# Patient Record
Sex: Female | Born: 1980 | Race: Black or African American | Hispanic: No | Marital: Married | Smoking: Never smoker
Health system: Southern US, Community
[De-identification: ages and names within clinical notes are randomized; demographics above are authoritative.]

## PROBLEM LIST (undated history)

## (undated) DIAGNOSIS — D649 Anemia, unspecified: Secondary | ICD-10-CM

## (undated) HISTORY — DX: Anemia, unspecified: D64.9

---

## 2009-03-10 ENCOUNTER — Ambulatory Visit: Payer: Self-pay | Admitting: Family Medicine

## 2009-03-10 ENCOUNTER — Other Ambulatory Visit: Admission: RE | Admit: 2009-03-10 | Discharge: 2009-03-10 | Payer: Self-pay | Admitting: Family Medicine

## 2009-05-02 ENCOUNTER — Ambulatory Visit: Payer: Self-pay | Admitting: Family Medicine

## 2009-07-29 ENCOUNTER — Inpatient Hospital Stay (HOSPITAL_COMMUNITY): Admission: AD | Admit: 2009-07-29 | Discharge: 2009-07-29 | Payer: Self-pay | Admitting: Obstetrics & Gynecology

## 2009-10-19 ENCOUNTER — Ambulatory Visit (HOSPITAL_COMMUNITY): Admission: RE | Admit: 2009-10-19 | Discharge: 2009-10-19 | Payer: Self-pay | Admitting: Obstetrics & Gynecology

## 2010-03-04 ENCOUNTER — Encounter (INDEPENDENT_AMBULATORY_CARE_PROVIDER_SITE_OTHER): Payer: Self-pay | Admitting: Obstetrics & Gynecology

## 2010-03-04 ENCOUNTER — Inpatient Hospital Stay (HOSPITAL_COMMUNITY): Admission: RE | Admit: 2010-03-04 | Discharge: 2010-03-06 | Payer: Self-pay | Admitting: Obstetrics & Gynecology

## 2010-03-07 ENCOUNTER — Ambulatory Visit: Payer: Self-pay | Admitting: Family Medicine

## 2010-03-07 ENCOUNTER — Ambulatory Visit: Payer: Self-pay | Admitting: Advanced Practice Midwife

## 2010-03-07 ENCOUNTER — Inpatient Hospital Stay (HOSPITAL_COMMUNITY): Admission: AD | Admit: 2010-03-07 | Discharge: 2010-03-07 | Payer: Self-pay | Admitting: Obstetrics & Gynecology

## 2010-10-28 LAB — COMPREHENSIVE METABOLIC PANEL
AST: 44 U/L — ABNORMAL HIGH (ref 0–37)
Albumin: 2.4 g/dL — ABNORMAL LOW (ref 3.5–5.2)
BUN: 3 mg/dL — ABNORMAL LOW (ref 6–23)
Calcium: 8.2 mg/dL — ABNORMAL LOW (ref 8.4–10.5)
Chloride: 106 mEq/L (ref 96–112)
Creatinine, Ser: 0.57 mg/dL (ref 0.4–1.2)
GFR calc Af Amer: >60 mL/min (ref >60)
Sodium: 137 mEq/L (ref 135–145)

## 2010-10-28 LAB — CBC
HCT: 35.4 % — ABNORMAL LOW (ref 36.0–46.0)
Hemoglobin: 11.7 g/dL — ABNORMAL LOW (ref 12.0–15.0)
Hemoglobin: 12.7 g/dL (ref 12.0–15.0)
Hemoglobin: 13.4 g/dL (ref 12.0–15.0)
MCH: 29.9 pg (ref 26.0–34.0)
MCHC: 32.6 g/dL (ref 30.0–36.0)
MCHC: 33.1 g/dL (ref 30.0–36.0)
MCHC: 33.4 g/dL (ref 30.0–36.0)
MCV: 89.5 fL (ref 78.0–100.0)
MCV: 91.2 fL (ref 78.0–100.0)
Platelets: 83 10*3/uL — ABNORMAL LOW (ref 150–400)
RBC: 4.24 MIL/uL (ref 3.87–5.11)
RBC: 4.52 MIL/uL (ref 3.87–5.11)
RDW: 14.7 % (ref 11.5–15.5)
RDW: 15.4 % (ref 11.5–15.5)
WBC: 6.9 10*3/uL (ref 4.0–10.5)

## 2010-10-28 LAB — TYPE AND SCREEN: Antibody Screen: NEGATIVE

## 2010-10-28 LAB — DIFFERENTIAL
Basophils Relative: 0 % (ref 0–1)
Neutro Abs: 4.8 10*3/uL (ref 1.7–7.7)
Neutrophils Relative %: 69 % (ref 43–77)

## 2010-10-28 LAB — SURGICAL PCR SCREEN: Staphylococcus aureus: NEGATIVE

## 2010-11-13 LAB — CBC
Hemoglobin: 13.2 g/dL (ref 12.0–15.0)
MCHC: 32.7 g/dL (ref 30.0–36.0)
Platelets: 147 10*3/uL — ABNORMAL LOW (ref 150–400)
RBC: 4.69 MIL/uL (ref 3.87–5.11)
WBC: 9.8 10*3/uL (ref 4.0–10.5)

## 2010-11-13 LAB — GC/CHLAMYDIA PROBE AMP, GENITAL
Chlamydia, DNA Probe: NEGATIVE
GC Probe Amp, Genital: NEGATIVE

## 2010-11-13 LAB — URINALYSIS, ROUTINE W REFLEX MICROSCOPIC
Nitrite: NEGATIVE
Specific Gravity, Urine: 1.025 (ref 1.005–1.030)

## 2010-11-13 LAB — WET PREP, GENITAL
Trich, Wet Prep: NONE SEEN
Yeast Wet Prep HPF POC: NONE SEEN

## 2010-11-13 LAB — HCG, QUANTITATIVE, PREGNANCY: hCG, Beta Chain, Quant, S: 86040 m[IU]/mL — ABNORMAL HIGH (ref <5)

## 2010-11-13 LAB — POCT PREGNANCY, URINE: Preg Test, Ur: POSITIVE

## 2011-04-18 ENCOUNTER — Encounter: Payer: Self-pay | Admitting: Family Medicine

## 2012-05-05 ENCOUNTER — Telehealth: Payer: Self-pay | Admitting: Family Medicine

## 2012-05-05 NOTE — Telephone Encounter (Signed)
LM

## 2013-06-22 ENCOUNTER — Ambulatory Visit (INDEPENDENT_AMBULATORY_CARE_PROVIDER_SITE_OTHER): Payer: BC Managed Care – PPO | Admitting: Medical

## 2013-06-22 ENCOUNTER — Encounter: Payer: Self-pay | Admitting: Medical

## 2013-06-22 VITALS — BP 90/70 | HR 68 | Temp 98.2°F | Resp 16 | Wt 144.0 lb

## 2013-06-22 DIAGNOSIS — S99921A Unspecified injury of right foot, initial encounter: Secondary | ICD-10-CM

## 2013-06-22 DIAGNOSIS — S8990XA Unspecified injury of unspecified lower leg, initial encounter: Secondary | ICD-10-CM

## 2013-06-22 DIAGNOSIS — I998 Other disorder of circulatory system: Secondary | ICD-10-CM

## 2013-06-22 DIAGNOSIS — R58 Hemorrhage, not elsewhere classified: Secondary | ICD-10-CM

## 2013-06-22 NOTE — Progress Notes (Signed)
Subjective: Here today for injury.  early this morning after getting out of the shower, stumped her right small toe against the bathroom door.  Fell to the ground in pain.   Toe has since been painful, limited ROM, feels swollen, but main issue is the pain.   Has used ice once, elevation.   Currently toe is throbbing in pain.   Objective: Gen: wd, wn, nad Skin: normal skin coloration of right small toe. MSK: right small toe tender throughout, decreased ROM due to pain, no obvious deformity or obvious swelling, rest of right foot and toes normal exam Pedal pulses and cap refill normal of right foot and small toe Neuro:  Seems to be somewhat decreased light touch sensation of right distal foot and 3rd - 5th toes, but otherwise normal sensation, strength not fully tested of right 5th toe due to pain   Assessment: Encounter Diagnoses  Name Primary?  . Toe injury, right, initial encounter Yes  . Ecchymosis     Plan: She declines xray at this time.   Buddy taped toe.  Script for post op shoe for support and protection.   Advised OTC Aleve or Ibuprofen for pain inflammation, ice, rest, buddy tape, use post op shoe, and elevation.  If not improving in regards to pain or sensation by Wednesday, then call back so we can order xray.  May take a few weeks to fully resolve, but wear the post op shoe 1-2 wk initially.  Recheck 10-14 days in general.

## 2013-06-22 NOTE — Patient Instructions (Signed)
RICE  Rest the foot, use the post op shoe, avoid re injury  Ice the foot 3-4 times daily if possible.  Compression - with buddy taping.  Elevate the leg when possible.   Begin Naprelan 750mg  today for pain.  tomorrow and the rest of the week, use Aleve 1-2 tablets once to twice daily or Ibuprofen, 3 tablets 3 times daily this week.

## 2016-07-18 ENCOUNTER — Other Ambulatory Visit: Payer: Self-pay | Admitting: Obstetrics & Gynecology

## 2016-07-18 DIAGNOSIS — N644 Mastodynia: Secondary | ICD-10-CM

## 2017-08-22 ENCOUNTER — Encounter: Payer: Self-pay | Admitting: Family Medicine

## 2017-08-22 ENCOUNTER — Ambulatory Visit: Payer: BC Managed Care – PPO | Admitting: Family Medicine

## 2017-08-22 VITALS — BP 124/82 | HR 79 | Temp 98.4°F | Ht 61.0 in | Wt 145.2 lb

## 2017-08-22 DIAGNOSIS — J029 Acute pharyngitis, unspecified: Secondary | ICD-10-CM | POA: Diagnosis not present

## 2017-08-22 DIAGNOSIS — J069 Acute upper respiratory infection, unspecified: Secondary | ICD-10-CM

## 2017-08-22 LAB — POCT RAPID STREP A (OFFICE): RAPID STREP A SCREEN: NEGATIVE

## 2017-08-22 MED ORDER — SULFAMETHOXAZOLE-TRIMETHOPRIM 800-160 MG PO TABS: 1.0000 | ORAL_TABLET | Freq: Two times a day (BID) | ORAL | 0 refills | Status: DC

## 2017-08-22 NOTE — Progress Notes (Signed)
Chief Complaint  Patient presents with  . Acute Visit    sore throat, sneezing, coughing, fatigue, congestion, tried OTC medication no relief    Subjective:  Kristina Butler is a 37 y.o. female who presents for a 4-5 day history of fatigue, sore throat, right ear ache, mild sinus pressure,post nasal drainage, hoarseness, and mild cough.  Denies fever, chills, body aches, rhinorrhea, nasal congestion, chest pain, palpitations, shortness of breath, wheezing, abdominal pain, N/V/D.   Treatment to date: Theraflu.  Denies sick contacts.  No other aggravating or relieving factors.  No other c/o.  ROS as in subjective.   Objective: Vitals:   08/22/17 1145  BP: 124/82  Pulse: 79  Temp: 98.4 F (36.9 C)  SpO2: 98%    General appearance: Alert, WD/WN, no distress, mildly ill appearing                             Skin: warm, no rash                           Head: no sinus tenderness                            Eyes: conjunctiva normal, corneas clear, PERRLA                            Ears: pearly TMs, external ear canals normal                          Nose: septum midline, turbinates swollen, with erythema and clear discharge             Mouth/throat: MMM, tongue normal, mild pharyngeal erythema, no edema, or exudate.                            Neck: supple, no adenopathy, no thyromegaly, nontender                          Heart: RRR, normal S1, S2, no murmurs                         Lungs: CTA bilaterally, no wheezes, rales, or rhonchi      Assessment: Acute pharyngitis, unspecified etiology - Plan: Rapid Strep A, sulfamethoxazole-trimethoprim (BACTRIM DS,SEPTRA DS) 800-160 MG tablet  Acute URI   Plan: Discussed diagnosis and treatment of URI and acute pharyngitis. Rapid strep swab negative. Counseled on viral illness and that antibiotics do not help with this. Bactrim prescribed in case she is not improving in the next 2-3 days.   Suggested symptomatic OTC remedies. Salt water  gargles. Rest, hydration. Nasal saline spray for congestion.  Tylenol or Ibuprofen OTC for fever and malaise.  Return as needed.

## 2017-08-22 NOTE — Patient Instructions (Signed)
You appear to have a viral illness. Your strep test is negative.  I recommend treating your symptoms for now and giving this more time. Tylenol or ibuprofen, salt water gargles, chloraseptic also if needed. Stay well hydrated, rest and if you get worse or are not improving in the next 2-3 days then take the antibiotic.

## 2018-01-14 ENCOUNTER — Ambulatory Visit (HOSPITAL_COMMUNITY)
Admission: EM | Admit: 2018-01-14 | Discharge: 2018-01-14 | Disposition: A | Discharge status: Home / Self Care | Payer: BC Managed Care – PPO | Attending: Emergency Medicine | Admitting: Emergency Medicine

## 2018-01-14 ENCOUNTER — Ambulatory Visit (INDEPENDENT_AMBULATORY_CARE_PROVIDER_SITE_OTHER): Payer: BC Managed Care – PPO

## 2018-01-14 ENCOUNTER — Encounter (HOSPITAL_COMMUNITY): Payer: Self-pay | Admitting: Emergency Medicine

## 2018-01-14 DIAGNOSIS — S90811A Abrasion, right foot, initial encounter: Secondary | ICD-10-CM

## 2018-01-14 DIAGNOSIS — M79671 Pain in right foot: Secondary | ICD-10-CM | POA: Diagnosis not present

## 2018-01-14 DIAGNOSIS — W19XXXA Unspecified fall, initial encounter: Secondary | ICD-10-CM | POA: Diagnosis not present

## 2018-01-14 DIAGNOSIS — S62325A Displaced fracture of shaft of fourth metacarpal bone, left hand, initial encounter for closed fracture: Secondary | ICD-10-CM

## 2018-01-14 MED ORDER — IBUPROFEN 800 MG PO TABS: 800.0000 mg | ORAL_TABLET | Freq: Three times a day (TID) | ORAL | 0 refills | Status: AC

## 2018-01-14 NOTE — Progress Notes (Signed)
Orthopedic Tech Progress Note Patient Details:  Sofie Hartiganisha T Shur 1980-10-12 [id number]  Ortho Devices Type of Ortho Device: Ace wrap, Ulna gutter splint Ortho Device/Splint Location: lue Ortho Device/Splint Interventions: Application   Post Interventions Patient Tolerated: Well Instructions Provided: Care of device   Nikki DomCrawford, Micah Galeno 01/14/2018, 11:52 AM

## 2018-01-14 NOTE — ED Notes (Signed)
Ortho aware of splint 

## 2018-01-14 NOTE — ED Provider Notes (Signed)
[facility]    CSN: [id number] Arrival date & time: 01/14/18  1005     History   Chief Complaint Chief Complaint  Patient presents with  . Hand Pain    HPI Kristina Butler is a 37 y.o. female no contributing past medical history presenting today for evaluation of left hand pain after a fall.  Patient states that she was in her bathtub and tried to kill a bug and accidentally slipped and fell.  Denies hitting head or loss of consciousness.  Denies changes in vision, nausea, vomiting.  Her main complaint is pain and swelling in her left hand.  Has had difficulty moving her left fourth finger.  Denies numbness or tingling.  Has been applying ice.  Also having pain along her right foot, she scratched this on the faucet, tolerating weightbearing, states pain mainly along scratch.  HPI  Past Medical History:  Diagnosis Date  . Anemia     There are no active problems to display for this patient.   History reviewed. No pertinent surgical history.  OB History   None      Home Medications    Prior to Admission medications   Medication Sig Start Date End Date Taking? Authorizing Provider  ibuprofen (ADVIL,MOTRIN) 800 MG tablet Take 1 tablet (800 mg total) by mouth 3 (three) times daily. 01/14/18   Lew DawesWieters, Hallie C, PA-C  SPRINTEC 28 0.25-35 MG-MCG tablet  06/27/17   [provider]  sulfamethoxazole-trimethoprim (BACTRIM DS,SEPTRA DS) 800-160 MG tablet Take 1 tablet by mouth 2 (two) times daily. 08/22/17   Avanell ShackletonHenson, Vickie L, NP-C    Family History History reviewed. No pertinent family history.  Social History Social History   Tobacco Use  . Smoking status: Never Smoker  . Smokeless tobacco: Never Used  Substance Use Topics  . Alcohol use: No    Frequency: Never  . Drug use: No     Allergies   Penicillins   Review of Systems Review of Systems  Constitutional: Negative for activity change and appetite change.  Eyes: Negative for pain and visual  disturbance.  Respiratory: Negative for shortness of breath.   Cardiovascular: Negative for chest pain.  Gastrointestinal: Negative for abdominal pain, nausea and vomiting.  Musculoskeletal: Positive for arthralgias, joint swelling and myalgias. Negative for back pain, gait problem, neck pain and neck stiffness.  Skin: Positive for wound. Negative for color change.  Neurological: Negative for dizziness, seizures, syncope, weakness, light-headedness, numbness and headaches.     Physical Exam Triage Vital Signs ED Triage Vitals [01/14/18 1033]  Enc Vitals Group     BP 129/88     Pulse Rate 66     Resp 18     Temp 98 F (36.7 C)     Temp Source Oral     SpO2 98 %     Weight      Height      Head Circumference      Peak Flow      Pain Score      Pain Loc      Pain Edu?      Excl. in GC?    No data found.  Updated Vital Signs BP 129/88 (BP Location: Right Arm)   Pulse 66   Temp 98 F (36.7 C) (Oral)   Resp 18   SpO2 98%   Visual Acuity Right Eye Distance:   Left Eye Distance:   Bilateral Distance:    Right Eye Near:   Left  Eye Near:    Bilateral Near:     Physical Exam  Constitutional: She appears well-developed and well-nourished. No distress.  HENT:  Head: Normocephalic and atraumatic.  Eyes: Conjunctivae are normal.  Neck: Neck supple.  Cardiovascular: Normal rate.  No murmur heard. Pulmonary/Chest: Effort normal. No respiratory distress.  Musculoskeletal: She exhibits edema, tenderness and deformity.  Significant swelling around left fourth metacarpal extending to fourth PIP, limited range of motion of fourth finger, significant tenderness over swelling, tenderness to palpation of distal radius and ulna.  Superficial scrape extending along the dorsum of foot and to the anterior distal shin.,  Tenderness along scrape, nontender along medial lateral malleolus, nontender to other metatarsals.  Gait without abnormality, full active range of motion at ankle.    Neurological: She is alert.  Skin: Skin is warm and dry.  Psychiatric: She has a normal mood and affect.  Nursing note and vitals reviewed.    UC Treatments / Results  Labs (all labs ordered are listed, but only abnormal results are displayed) Labs Reviewed - No data to display  EKG None  Radiology Dg Hand Complete Left  Result Date: 01/14/2018 CLINICAL DATA:  Hand swelling, fell in the shower this morning EXAM: LEFT HAND - COMPLETE 3+ VIEW COMPARISON:  None FINDINGS: Osseous mineralization normal for technique. Joint spaces preserved. Oblique fracture of the fourth metacarpal diaphysis, minimally displaced radially and volar. Soft tissue swelling LEFT ring finger. No additional fracture, dislocation, or bone destruction. IMPRESSION: Displaced oblique fracture of the LEFT fourth metacarpal diaphysis Electronically Signed   By: Ulyses Southward M.D.   On: 01/14/2018 10:55    Procedures Procedures (including critical care time)  Medications Ordered in UC Medications - No data to display  Initial Impression / Assessment and Plan / UC Course  I have reviewed the triage vital signs and the nursing notes.  Pertinent labs & imaging results that were available during my care of the patient were reviewed by me and considered in my medical decision making (see chart for details).     Patient with fourth left metacarpal shaft fracture.  Will apply ulnar gutter splint and have follow-up with orthopedics.  NSAIDs for pain.  Ice and elevation.  Discussed strict return precautions. Patient verbalized understanding and is agreeable with plan.  Final Clinical Impressions(s) / UC Diagnoses   Final diagnoses:  Closed displaced fracture of shaft of fourth metacarpal bone of left hand, initial encounter     Discharge Instructions     Use anti-inflammatories for pain/swelling. You may take up to 800 mg Ibuprofen every 8 hours with food. You may supplement Ibuprofen with Tylenol [phone] mg every 8  hours.   Follow-up with orthopedics, please call them today    ED Prescriptions    Medication Sig Dispense Auth. Provider   ibuprofen (ADVIL,MOTRIN) 800 MG tablet Take 1 tablet (800 mg total) by mouth 3 (three) times daily. 30 tablet Wieters, Eminence C, PA-C     Controlled Substance Prescriptions [place] Controlled Substance Registry consulted? Not Applicable   Lew Dawes, [place] 01/14/18 1106

## 2018-01-14 NOTE — ED Triage Notes (Signed)
Pt here for left hand pain and right foot pain after fall in bathtub last night

## 2018-01-14 NOTE — Discharge Instructions (Signed)
Use anti-inflammatories for pain/swelling. You may take up to 800 mg Ibuprofen every 8 hours with food. You may supplement Ibuprofen with Tylenol [phone] every 8 hours.   Follow-up with orthopedics, please call them today

## 2018-01-14 NOTE — ED Notes (Signed)
Ortho tech at bedside 

## 2018-05-30 ENCOUNTER — Other Ambulatory Visit (INDEPENDENT_AMBULATORY_CARE_PROVIDER_SITE_OTHER): Payer: BC Managed Care – PPO

## 2018-05-30 DIAGNOSIS — Z23 Encounter for immunization: Secondary | ICD-10-CM | POA: Diagnosis not present

## 2018-10-25 IMAGING — DX DG HAND COMPLETE 3+V*L*
3 series · 3 of 3 positions shown · non-contrast
Comparison: None

CLINICAL DATA: Hand swelling, fell in the shower this morning

EXAM:
LEFT HAND - COMPLETE 3+ VIEW

[hand pa]
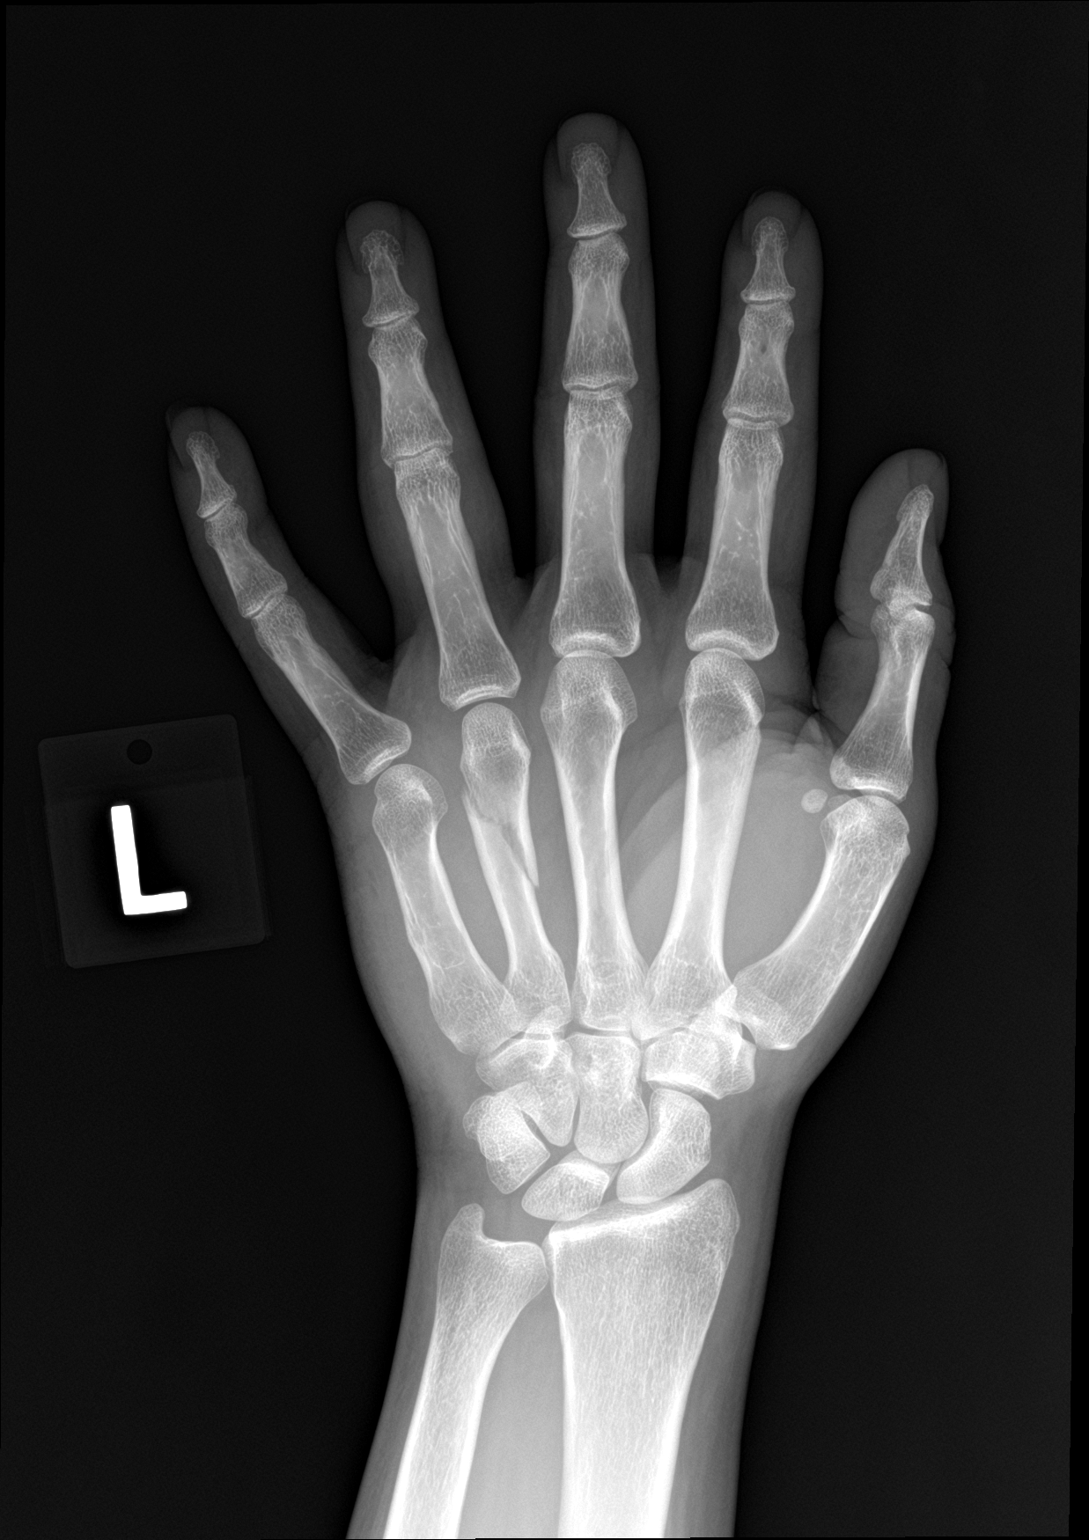

[hand obl]
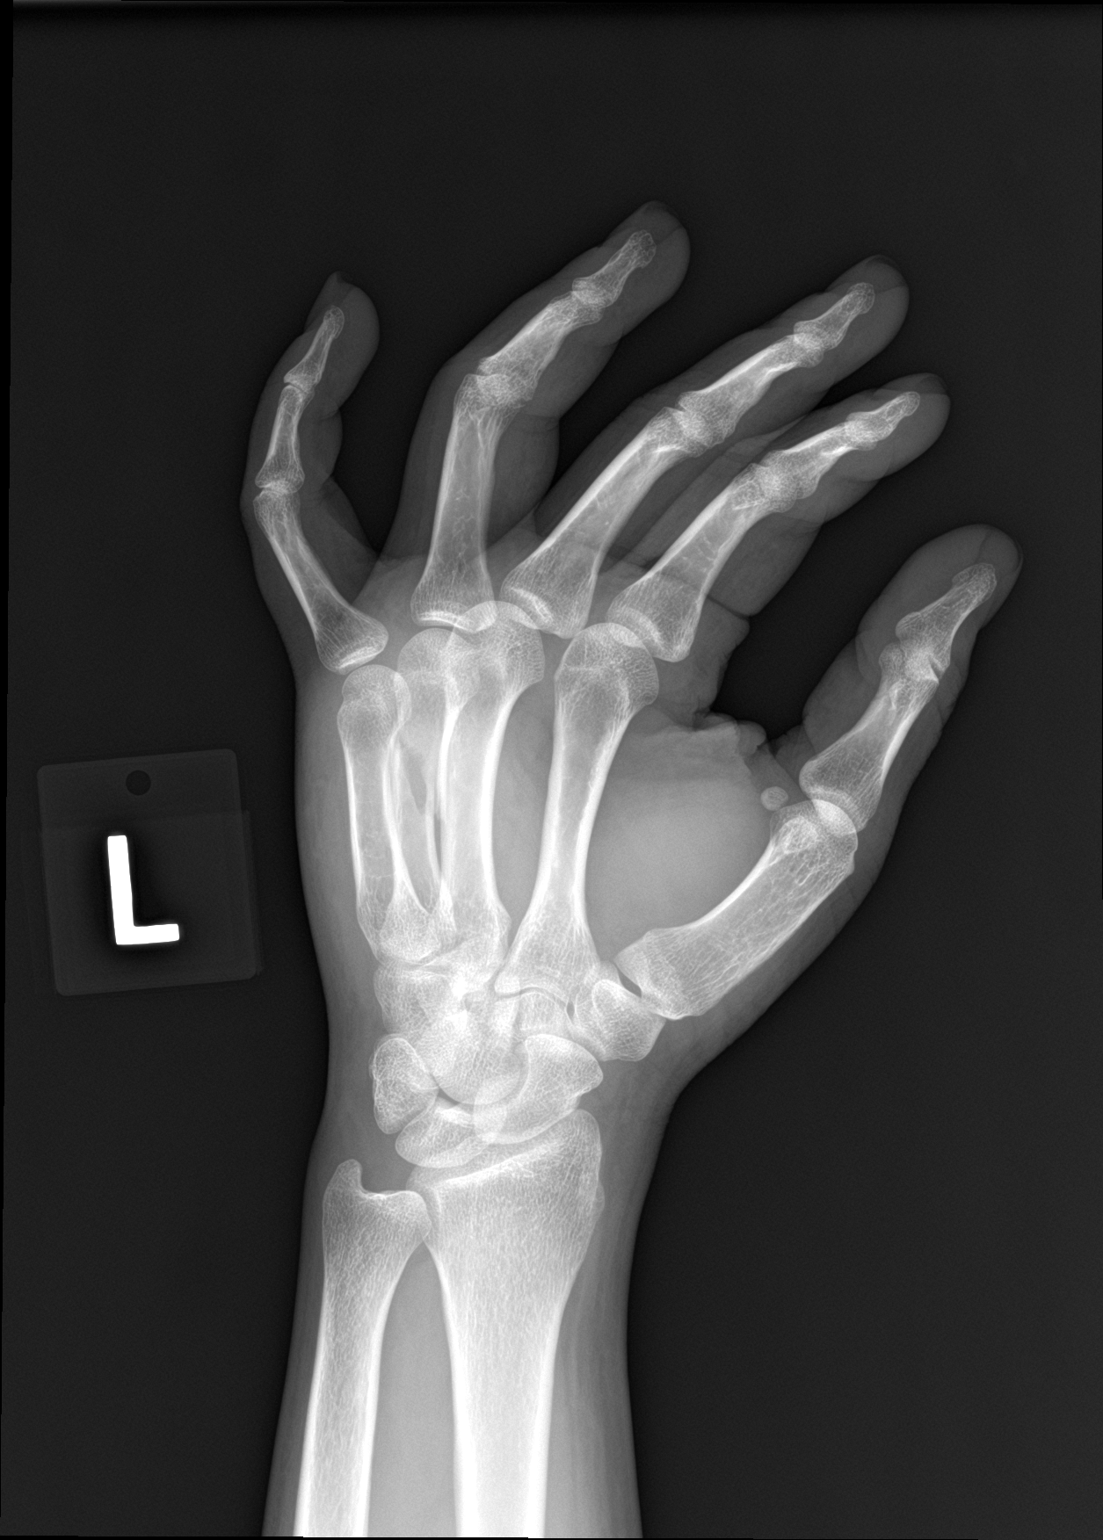

[hand lat]
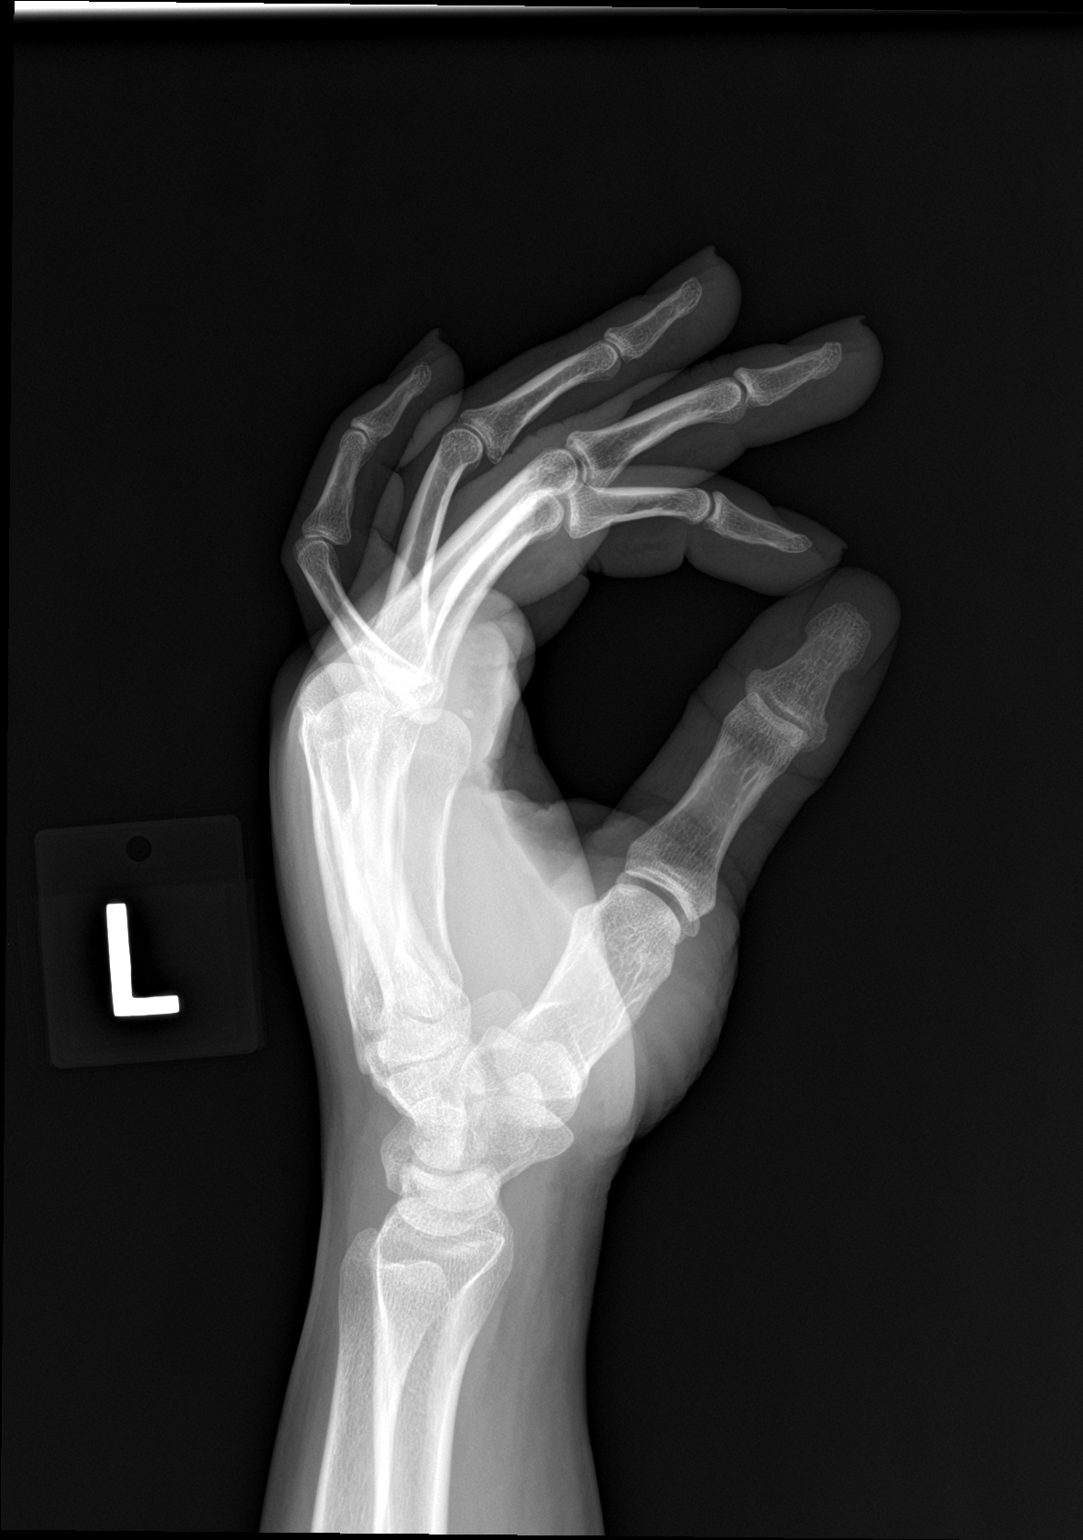

[3 of 3 positions shown; findings below may reference images not displayed]

FINDINGS: Osseous mineralization normal for technique.

Joint spaces preserved.

Oblique fracture of the fourth metacarpal diaphysis, minimally
displaced radially and volar.

Soft tissue swelling LEFT ring finger.

No additional fracture, dislocation, or bone destruction.
IMPRESSION: Displaced oblique fracture of the LEFT fourth metacarpal diaphysis

## 2019-05-14 ENCOUNTER — Other Ambulatory Visit: Payer: BC Managed Care – PPO

## 2019-05-22 ENCOUNTER — Other Ambulatory Visit (INDEPENDENT_AMBULATORY_CARE_PROVIDER_SITE_OTHER): Payer: BC Managed Care – PPO

## 2019-05-22 ENCOUNTER — Other Ambulatory Visit: Payer: Self-pay

## 2019-05-22 DIAGNOSIS — Z23 Encounter for immunization: Secondary | ICD-10-CM | POA: Diagnosis not present

## 2020-06-13 ENCOUNTER — Other Ambulatory Visit: Payer: Self-pay

## 2020-06-13 ENCOUNTER — Other Ambulatory Visit: Payer: BC Managed Care – PPO

## 2021-08-11 ENCOUNTER — Other Ambulatory Visit (INDEPENDENT_AMBULATORY_CARE_PROVIDER_SITE_OTHER): Payer: BC Managed Care – PPO

## 2021-08-11 ENCOUNTER — Other Ambulatory Visit: Payer: Self-pay

## 2021-08-11 DIAGNOSIS — Z23 Encounter for immunization: Secondary | ICD-10-CM

## 2022-07-03 ENCOUNTER — Other Ambulatory Visit (INDEPENDENT_AMBULATORY_CARE_PROVIDER_SITE_OTHER): Payer: BC Managed Care – PPO

## 2022-07-03 DIAGNOSIS — Z23 Encounter for immunization: Secondary | ICD-10-CM

## 2022-09-11 LAB — HM MAMMOGRAPHY

## 2022-09-12 LAB — LAB REPORT - SCANNED
A1c: 5.7
EGFR: 95

## 2022-09-17 LAB — RESULTS CONSOLE HPV: CHL HPV: NEGATIVE

## 2022-09-17 LAB — HM PAP SMEAR: HM Pap smear: NEGATIVE

## 2022-12-14 ENCOUNTER — Encounter (HOSPITAL_COMMUNITY): Payer: Self-pay

## 2022-12-14 ENCOUNTER — Ambulatory Visit (HOSPITAL_COMMUNITY)
Admission: EM | Admit: 2022-12-14 | Discharge: 2022-12-14 | Disposition: A | Discharge status: Home / Self Care | Payer: BC Managed Care – PPO | Attending: Urgent Care | Admitting: Urgent Care

## 2022-12-14 DIAGNOSIS — J069 Acute upper respiratory infection, unspecified: Secondary | ICD-10-CM

## 2022-12-14 MED ORDER — PSEUDOEPHEDRINE HCL 60 MG PO TABS: 60.0000 mg | ORAL_TABLET | Freq: Three times a day (TID) | ORAL | 0 refills | Status: AC | PRN

## 2022-12-14 MED ORDER — CETIRIZINE HCL 10 MG PO TABS: 10.0000 mg | ORAL_TABLET | Freq: Every day | ORAL | 0 refills | Status: AC

## 2022-12-14 MED ORDER — PROMETHAZINE-DM 6.25-15 MG/5ML PO SYRP: 5.0000 mL | ORAL_SOLUTION | Freq: Three times a day (TID) | ORAL | 0 refills | Status: AC | PRN

## 2022-12-14 NOTE — ED Provider Notes (Signed)
[facility]   MRN: [id number] DOB: 02/13/1981  Subjective:   Kristina Butler is a 42 y.o. female presenting for 2-day history of cough, congestion, sinus drainage, postnasal drainage, throat congestion, headaches, dizziness last night when she got up to use the restroom.  Has also had ringing in her ears.  Denies chest pain, shortness of breath or wheezing, body pains.  No history of asthma.  No smoking of any kind including cigarettes, cigars, vaping, marijuana use.  No history of clotting disorders.  Patient has used TheraFlu.  No current facility-administered medications for this encounter.  Current Outpatient Medications:    SPRINTEC 28 0.25-35 MG-MCG tablet, , Disp: , Rfl:    ibuprofen (ADVIL,MOTRIN) 800 MG tablet, Take 1 tablet (800 mg total) by mouth 3 (three) times daily., Disp: 30 tablet, Rfl: 0   sulfamethoxazole-trimethoprim (BACTRIM DS,SEPTRA DS) 800-160 MG tablet, Take 1 tablet by mouth 2 (two) times daily., Disp: 20 tablet, Rfl: 0   Allergies  Allergen Reactions   Penicillins     Past Medical History:  Diagnosis Date   Anemia      History reviewed. No pertinent surgical history.  History reviewed. No pertinent family history.  Social History   Tobacco Use   Smoking status: Never   Smokeless tobacco: Never  Substance Use Topics   Alcohol use: No   Drug use: No    ROS   Objective:   Vitals: BP (!) 142/99 (BP Location: Left Arm)   Pulse 82   Temp 98.5 F (36.9 C) (Oral)   Resp 18   LMP 11/19/2022   SpO2 98%   BP Readings from Last 3 Encounters:  12/14/22 (!) 142/99  01/14/18 129/88  08/22/17 124/82   Physical Exam Constitutional:      General: She is not in acute distress.    Appearance: Normal appearance. She is well-developed and normal weight. She is not ill-appearing, toxic-appearing or diaphoretic.  HENT:     Head: Normocephalic and atraumatic.     Right Ear: Tympanic membrane, ear canal and external ear normal. No  drainage or tenderness. No middle ear effusion. There is no impacted cerumen. Tympanic membrane is not erythematous or bulging.     Left Ear: Tympanic membrane, ear canal and external ear normal. No drainage or tenderness.  No middle ear effusion. There is no impacted cerumen. Tympanic membrane is not erythematous or bulging.     Nose: Congestion present. No rhinorrhea.     Mouth/Throat:     Mouth: Mucous membranes are moist. No oral lesions.     Pharynx: No pharyngeal swelling, oropharyngeal exudate, posterior oropharyngeal erythema or uvula swelling.     Tonsils: No tonsillar exudate or tonsillar abscesses.     Comments: Thick streaks of postnasal drainage overlying pharynx. Eyes:     General: No scleral icterus.       Right eye: No discharge.        Left eye: No discharge.     Extraocular Movements: Extraocular movements intact.     Right eye: Normal extraocular motion.     Left eye: Normal extraocular motion.     Conjunctiva/sclera: Conjunctivae normal.  Cardiovascular:     Rate and Rhythm: Normal rate and regular rhythm.     Heart sounds: Normal heart sounds. No murmur heard.    No friction rub. No gallop.  Pulmonary:     Effort: Pulmonary effort is normal. No respiratory distress.     Breath sounds: No stridor. No  wheezing, rhonchi or rales.  Chest:     Chest wall: No tenderness.  Musculoskeletal:     Cervical back: Normal range of motion and neck supple.  Lymphadenopathy:     Cervical: No cervical adenopathy.  Skin:    General: Skin is warm and dry.  Neurological:     General: No focal deficit present.     Mental Status: She is alert and oriented to person, place, and time.     Cranial Nerves: No cranial nerve deficit.     Motor: No weakness.     Coordination: Coordination normal.     Gait: Gait normal.  Psychiatric:        Mood and Affect: Mood normal.        Behavior: Behavior normal.        Thought Content: Thought content normal.        Judgment: Judgment normal.      Assessment and Plan :   PDMP not reviewed this encounter.  1. Viral URI with cough    Deferred imaging given clear cardiopulmonary exam, hemodynamically stable vital signs.  Patient declined COVID testing.  Low PERC score, will defer ER visit.  Suspect viral URI, viral syndrome. Physical exam findings reassuring and vital signs stable for discharge. Advised supportive care, offered symptomatic relief. Counseled patient on potential for adverse effects with medications prescribed/recommended today, ER and return-to-clinic precautions discussed, patient verbalized understanding.     Wallis Bamberg, [place] 12/14/22 [phone]

## 2022-12-14 NOTE — ED Triage Notes (Signed)
Here for a 2-day cough and nasal congestion. Pt took Theraflu.

## 2022-12-14 NOTE — Discharge Instructions (Addendum)
We will manage this as a viral upper respiratory illness. For sore throat or cough try using a honey-based tea. Use 3 teaspoons of honey with juice squeezed from half lemon. Place shaved pieces of ginger into 1/2-1 cup of water and warm over stove top. Then mix the ingredients and repeat every 4 hours as needed. Please take ibuprofen 600mg  every 6 hours with food alternating with OR taken together with Tylenol 500mg -650mg  every 6 hours for throat pain, fevers, aches and pains. Hydrate very well with at least 2 liters of water. Eat light meals such as soups (chicken and noodles, vegetable, chicken and wild rice).  Do not eat foods that you are allergic to.  Taking an antihistamine like Zyrtec (10mg  daily) can help against postnasal drainage, sinus congestion which can cause sinus pain, sinus headaches, throat pain, painful swallowing, coughing.  You can take this together with pseudoephedrine (Sudafed) at a dose of 60 mg 3 times a day or twice daily as needed for the same kind of nasal drip, congestion.  Use cough medication as needed.

## 2022-12-17 ENCOUNTER — Ambulatory Visit: Payer: BC Managed Care – PPO | Admitting: Medical

## 2022-12-19 ENCOUNTER — Encounter: Payer: Self-pay | Admitting: Internal Medicine

## 2022-12-19 ENCOUNTER — Ambulatory Visit: Payer: BC Managed Care – PPO | Admitting: Medical

## 2022-12-19 VITALS — BP 118/70 | HR 70 | Wt 148.8 lb

## 2022-12-19 DIAGNOSIS — R7309 Other abnormal glucose: Secondary | ICD-10-CM

## 2022-12-19 DIAGNOSIS — D696 Thrombocytopenia, unspecified: Secondary | ICD-10-CM

## 2022-12-19 DIAGNOSIS — D72819 Decreased white blood cell count, unspecified: Secondary | ICD-10-CM | POA: Diagnosis not present

## 2022-12-19 DIAGNOSIS — H1032 Unspecified acute conjunctivitis, left eye: Secondary | ICD-10-CM

## 2022-12-19 DIAGNOSIS — J069 Acute upper respiratory infection, unspecified: Secondary | ICD-10-CM

## 2022-12-19 LAB — POCT GLYCOSYLATED HEMOGLOBIN (HGB A1C): Hemoglobin A1C: 5.6 % (ref 4.0–5.6)

## 2022-12-19 LAB — GLUCOSE, POCT (MANUAL RESULT ENTRY): POC Glucose: 85 mg/dl (ref 70–99)

## 2022-12-19 NOTE — Progress Notes (Signed)
Subjective:  Kristina Butler is a 42 y.o. female who presents for Chief Complaint  Patient presents with   discuss lab results    Discuss labs results- had labs done with obgyn in January and then had labs done in February from obgyn.  Discuss recent URI at ER     Here for concerns.  Has been having some repeat illness concerns.  Accompanied by her husband.  Been seeing OB/gyn for all of her care.  Had some recent labs and WBC, platelets low in January.  Repeat labs in February were still too low.  Has upcoming labs planned for this month.  She notes around second week of April was on a cruise vacation.  At that time had cough, congestion and pink eye.  No fever.  That illness resolved in about a week.   Used several medications including eye drops, throat lozenge.  Possible was on antibiotic.  This week started having some recurrent or similar symptoms.  She feels congested, coughing.  Having some sore throat.   No fever.  Has had some vomiting from coughing. But no nausea, no diarrhea.   No back or chest pain, no urinary concerns.   Feels like something in throat but no specific wheezing or SOB.   Left eye is feeling like pink eye again.   Using sudafed.  Left eye is red.  She started using eye drops this week she had left over from the cruise.  And so far the eye drops are helping.   No sick contacts currently.  Was seen by [facility] 5 days ago, diagnosed with URI.  Was given sudafed, promethazine, and zyrtec.  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  She has never seen hematology.  Back when she had a prior pregnancy there was a question mark about lupus but she does not have any other major health issues.  She does feel like her skin looks darker than it used to and she does not tolerate the sun as well as she used to.  Her husband says that she eats ice cream late in the evening and eats [employer] a couple times per week as well as some other sugary  foods  No other aggravating or relieving factors.    No other c/o.  Past Medical History:  Diagnosis Date   Anemia    Current Outpatient Medications on File Prior to Visit  Medication Sig Dispense Refill   cetirizine (ZYRTEC ALLERGY) 10 MG tablet Take 1 tablet (10 mg total) by mouth daily. 30 tablet 0   promethazine-dextromethorphan (PROMETHAZINE-DM) 6.25-15 MG/5ML syrup Take 5 mLs by mouth 3 (three) times daily as needed for cough. 200 mL 0   pseudoephedrine (SUDAFED) 60 MG tablet Take 1 tablet (60 mg total) by mouth every 8 (eight) hours as needed for congestion. 30 tablet 0   SPRINTEC 28 0.25-35 MG-MCG tablet      ibuprofen (ADVIL,MOTRIN) 800 MG tablet Take 1 tablet (800 mg total) by mouth 3 (three) times daily. (Patient not taking: Reported on 12/19/2022) 30 tablet 0   No current facility-administered medications on file prior to visit.   No family history on file.   The following portions of the patient's history were reviewed and updated as appropriate: allergies, current medications, past family history, past medical history, past social history, past surgical history and problem list.  ROS Otherwise as in subjective above    Objective: BP 118/70   Pulse 70  Wt 148 lb 12.8 oz (67.5 kg)   LMP 11/19/2022   BMI 28.12 kg/m   Wt Readings from Last 3 Encounters:  12/19/22 148 lb 12.8 oz (67.5 kg)  08/22/17 145 lb 3.2 oz (65.9 kg)  06/22/13 144 lb (65.3 kg)   General appearance: alert, no distress, well developed, well nourished HEENT: normocephalic, sclerae anicteric, conjunctiva left eye with some mild injection and erythema, no crust or watery discharge noted, otherwise eyes appear normal, TMs pearly, nares patent, no discharge or erythema, pharynx normal Oral cavity: MMM, no lesions Neck: supple, no lymphadenopathy, no thyromegaly, no masses Heart: RRR, normal S1, S2, no murmurs Lungs: CTA bilaterally, no wheezes, rhonchi, or rales Abdomen: +bs, soft, non tender,  non distended, no masses, no hepatomegaly, no splenomegaly Pulses: 2+ radial pulses, 2+ pedal pulses, normal cap refill Ext: no edema   Assessment: Encounter Diagnoses  Name Primary?   Low platelet count (HCC) Yes   Leukopenia, unspecified type    Upper respiratory tract infection, unspecified type    Acute conjunctivitis of left eye, unspecified acute conjunctivitis type      Plan: We discussed her symptoms and concerns.  Overall she has been in good health.  She has had 2 recent what appears to be viral URI illnesses.  The first a month ago that lasted about a week.  She currently has a fairly normal exam within the left eye and she does not look significantly ill.  Vital signs are stable.  I tried to reassure her that she has not really had any major health issues and he has 2 recent illnesses seen like mild viral URIs  She will continue her current treatment with decongestant allergy pill rest and hydration.  She will continue her eyedrops for pinkeye for the next 5 to 7 days.  We discussed handwashing and hygiene and preventing spread of pinkeye.  We discussed warm compresses for the pinkeye.  I reviewed some lab work that she had in January 2024 from gynecology  I reviewed mammogram and Pap smear from January that was normal.  Recent labs in January showed platelets of 129,000, slightly low white blood cells 3.6K/UL, lipids were normal, hemoglobin A1c was slightly elevated at 5.7%, glucose was 82, 6 comprehensive metabolic panel was normal except for calcium at 8.6 slightly low with an alkaline phosphate slightly low at 40, BUN 8 otherwise, his metabolic panel was normal, TSH was normal,  Looking back in the chart record she has had low platelets for many years so currently her platelets are stable.  She has never seen hematology or had a specific diagnosis.  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  Nevertheless referral to hematology for consult  I tried to reassure her that there is no obvious reason to  fear some other major health issues given that she has no other symptoms and has enjoyed good health overall.  She is up-to-date on cancer screenings, had some recent other lab work above that was reassuring, vital signs are good, she appears healthy.  We did discuss her diet which she could do better with less sugar and less junk food   Izza was seen today for discuss lab results.  Diagnoses and all orders for this visit:  Low platelet count (HCC) -     Ambulatory referral to Hematology / Oncology  Leukopenia, unspecified type -     Ambulatory referral to Hematology / Oncology  Upper respiratory tract infection, unspecified type  Acute conjunctivitis of left eye, unspecified acute conjunctivitis type  Spent > 30 minutes face to face with patient in discussion of symptoms, evaluation, plan and recommendations.    Follow up: pending consult

## 2022-12-19 NOTE — Addendum Note (Signed)
Addended by: Herminio Commons A on: 12/19/2022 11:16 AM   Modules accepted: Orders

## 2023-01-20 NOTE — Progress Notes (Unsigned)
Rescheduled

## 2023-01-21 ENCOUNTER — Inpatient Hospital Stay: Payer: BC Managed Care – PPO

## 2023-01-21 ENCOUNTER — Inpatient Hospital Stay: Payer: BC Managed Care – PPO | Admitting: Hematology and Oncology

## 2023-01-21 DIAGNOSIS — D696 Thrombocytopenia, unspecified: Secondary | ICD-10-CM

## 2023-03-21 NOTE — Progress Notes (Deleted)
[facility] Initial Visit:  Patient Care Team: Tysinger, Kermit Balo, PA-C as PCP - General (Family Medicine) Shea Evans, MD as Consulting Physician (Obstetrics and Gynecology)  CHIEF COMPLAINTS/PURPOSE OF CONSULTATION:  HISTORY OF PRESENTING ILLNESS: Kristina Butler 42 y.o. female is here because of thrombocytopenia Medical history notable for alopecia areta, dermatosis papulosis nigra  March 05, 2010 WBC 9.8 hemoglobin 13.4 platelet count 87.  Platelet count confirmed by smear; large platelets present March 07, 2010 WBC 6.9 hemoglobin 12.0 platelet count 100,000; 69 segs 21 lymphs 8 monos 1 EO  October 10, 2022: WBC 3.6 hemoglobin 13.3 MCV 86 platelet count 129  Review of Systems - Oncology  MEDICAL HISTORY: Past Medical History:  Diagnosis Date   Anemia     SURGICAL HISTORY: No past surgical history on file.  SOCIAL HISTORY: Social History   Socioeconomic History   Marital status: Married    Spouse name: Not on file   Number of children: Not on file   Years of education: Not on file   Highest education level: Not on file  Occupational History   Not on file  Tobacco Use   Smoking status: Never   Smokeless tobacco: Never  Substance and Sexual Activity   Alcohol use: No   Drug use: No   Sexual activity: Not on file  Other Topics Concern   Not on file  Social History Narrative   Not on file   Social Determinants of Health   Financial Resource Strain: Not on file  Food Insecurity: Not on file  Transportation Needs: Not on file  Physical Activity: Not on file  Stress: Not on file  Social Connections: Not on file  Intimate Partner Violence: Not on file    FAMILY HISTORY No family history on file.  ALLERGIES:  is allergic to penicillins.  MEDICATIONS:  Current Outpatient Medications  Medication Sig Dispense Refill   cetirizine (ZYRTEC ALLERGY) 10 MG tablet Take 1 tablet (10 mg total) by mouth daily. 30 tablet 0   ibuprofen  (ADVIL,MOTRIN) 800 MG tablet Take 1 tablet (800 mg total) by mouth 3 (three) times daily. (Patient not taking: Reported on 12/19/2022) 30 tablet 0   promethazine-dextromethorphan (PROMETHAZINE-DM) 6.25-15 MG/5ML syrup Take 5 mLs by mouth 3 (three) times daily as needed for cough. 200 mL 0   pseudoephedrine (SUDAFED) 60 MG tablet Take 1 tablet (60 mg total) by mouth every 8 (eight) hours as needed for congestion. 30 tablet 0   SPRINTEC 28 0.25-35 MG-MCG tablet      No current facility-administered medications for this visit.    PHYSICAL EXAMINATION:  ECOG PERFORMANCE STATUS: {CHL ONC ECOG PS:[id number]}   There were no vitals filed for this visit.  There were no vitals filed for this visit.   Physical Exam   LABORATORY DATA: I have personally reviewed the data as listed:  No visits with results within 1 Month(s) from this visit.  Latest known visit with results is:  Scanned Document on 01/16/2023  Component Date Value Ref Range Status   A1c 09/12/2022 5.7   Final   Abstracted by HIM   EGFR 09/12/2022 95.0   Final    RADIOGRAPHIC STUDIES: I have personally reviewed the radiological images as listed and agree with the findings in the report  No results found.  ASSESSMENT/PLAN Cancer Staging  No matching staging information was found for the patient.    No problem-specific Assessment & Plan notes found for this encounter.    No orders  of the defined types were placed in this encounter.     minutes was spent in patient care.  This included time spent preparing to see the patient (e.g., review of tests), obtaining and/or reviewing separately obtained history, counseling and educating the patient/family/caregiver, ordering medications, tests, or procedures; documenting clinical information in the electronic or other health record, independently interpreting results and communicating results to the patient/family/caregiver as well as coordination of care.       All  questions were answered. The patient knows to call the clinic with any problems, questions or concerns.  This note was electronically signed.    Loni Muse, MD  03/21/2023 11:21 AM

## 2023-03-22 ENCOUNTER — Inpatient Hospital Stay: Payer: BC Managed Care – PPO

## 2023-03-22 ENCOUNTER — Inpatient Hospital Stay: Payer: BC Managed Care – PPO | Attending: Hematology and Oncology | Admitting: Oncology

## 2023-06-17 ENCOUNTER — Other Ambulatory Visit (INDEPENDENT_AMBULATORY_CARE_PROVIDER_SITE_OTHER): Payer: BC Managed Care – PPO

## 2023-06-17 DIAGNOSIS — Z23 Encounter for immunization: Secondary | ICD-10-CM

## 2023-08-29 NOTE — Progress Notes (Signed)
 Return Patient Visit 08/29/2023 Kristina Butler 43 y.o. female Chief Complaint  Patient presents with  . Follow-up    Hair loss   SUBJECTIVE: Here for f/u localized AA of the scalp and eyebrows  LV 6/24 at which time started on clobetasol ointment PRN to alopecic patch in scalp + tacrolimus ointment PRN to eyebrows. Also started on minox 1.25mg  daily.   States hair in patch is growing normally, she can't even remember where it was. Hasn't been applying the clobetasol bc she doesn't need it. The tacrolimus ointment to the eyebrows seemed to make it worse. Tolerating PO minox w/o issues, hasn't noticed more thickness of hair. No scalp symptoms or new patches.   Acne, controlled with Benzamycin every day   Disease hx:  Was getting hair straightened in Nov 2023, hairstylist found a bald spot on back; has also noticed hair thinning for some time Went to a dermatologist month (records unavailable) sounds like was prescribed dermasmooth BID since Nov 2023 Thinks eyebrows were getting thinner for past three years (previously waxed eyebrows) Washes BIW Dyes 2-3x year Doesn't wear tight hairstyles, has rarely wore braids Denies weight changes/feelings of hotness/coldness  OBJECTIVE: BP (!) 126/99   Pulse 90   Ht 1.524 m (5')   Wt 63.5 kg (140 lb)   BMI 27.34 kg/m   Examination demonstrated: Rare exclamation point hairs on lateral eyebrows No alopecic patches in scalp Nails WNL Small DPNs on face Mild acne papules  ASSESSMENT: 1. 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 Acne vulgaris        Localized AA, improved with TCS  PLAN: The above diagnosis and treatment options were reviewed with the patient. Recommend sunscreen Clobetasol 0.05% ointment TIW PRN if she has a flare - will also reach out to us  if this happens TAC 0.025% cream for eyebrows three times per week PRN until  hair regrows - didn't tolerate tacrolimus Continue PO minoxidil 1.25 mg QD Side effects of meds and approipriate application reviewed Benzamycin QD to face for acne - recommend getting new bottle every 3 months  Rec'd OTC RoC correxion retinol serum  Prescriptions given today: Orders Placed This Encounter  Medications  . triamcinolone acetonide (KENALOG) 0.025 % cream    Sig: Apply a thin coat to the eyebrows Monday, Wednesday, Friday for 8 weeks    Dispense:  80 g    Refill:  0  . minoxidiL (LONITEN) 2.5 mg tablet    Sig: Take half tablet per day    Dispense:  45 tablet    Refill:  3    The key side effects were reviewed with the patient.  RTC Return for PRN. - but sooner as needed.  Kristina Butler [employer], MD [facility] PGY2

## 2023-08-31 NOTE — Progress Notes (Signed)
 I saw and evaluated the patient together with the resident, and agree with the assessment and plan as outlined in the note.

## 2023-12-16 ENCOUNTER — Inpatient Hospital Stay: Payer: Self-pay

## 2023-12-16 ENCOUNTER — Inpatient Hospital Stay: Payer: Self-pay | Attending: Hematology and Oncology | Admitting: Hematology and Oncology

## 2023-12-16 VITALS — BP 134/92 | HR 96 | Temp 98.7°F | Resp 17 | Wt 146.6 lb

## 2023-12-16 DIAGNOSIS — D72819 Decreased white blood cell count, unspecified: Secondary | ICD-10-CM

## 2023-12-16 DIAGNOSIS — D696 Thrombocytopenia, unspecified: Secondary | ICD-10-CM | POA: Diagnosis not present

## 2023-12-16 DIAGNOSIS — Z79899 Other long term (current) drug therapy: Secondary | ICD-10-CM | POA: Diagnosis not present

## 2023-12-16 DIAGNOSIS — L639 Alopecia areata, unspecified: Secondary | ICD-10-CM | POA: Diagnosis not present

## 2023-12-16 LAB — CBC WITH DIFFERENTIAL/PLATELET
Abs Immature Granulocytes: 0.01 10*3/uL (ref 0.00–0.07)
Basophils Absolute: 0 10*3/uL (ref 0.0–0.1)
Basophils Relative: 1 %
Eosinophils Absolute: 0 10*3/uL (ref 0.0–0.5)
Eosinophils Relative: 1 %
HCT: 41.7 % (ref 36.0–46.0)
Hemoglobin: 13.8 g/dL (ref 12.0–15.0)
Immature Granulocytes: 0 %
Lymphocytes Relative: 27 %
Lymphs Abs: 0.8 10*3/uL (ref 0.7–4.0)
MCH: 27.8 pg (ref 26.0–34.0)
MCHC: 33.1 g/dL (ref 30.0–36.0)
MCV: 83.9 fL (ref 80.0–100.0)
Monocytes Absolute: 0.3 10*3/uL (ref 0.1–1.0)
Monocytes Relative: 10 %
Neutro Abs: 1.8 10*3/uL (ref 1.7–7.7)
Neutrophils Relative %: 61 %
Platelets: 159 10*3/uL (ref 150–400)
RBC: 4.97 MIL/uL (ref 3.87–5.11)
RDW: 12.1 % (ref 11.5–15.5)
WBC: 2.9 10*3/uL — ABNORMAL LOW (ref 4.0–10.5)
nRBC: 0 % (ref 0.0–0.2)

## 2023-12-16 LAB — CMP (CANCER CENTER ONLY)
ALT: 13 U/L (ref 0–44)
AST: 18 U/L (ref 15–41)
Albumin: 3.9 g/dL (ref 3.5–5.0)
Alkaline Phosphatase: 37 U/L — ABNORMAL LOW (ref 38–126)
Anion gap: 5 (ref 5–15)
BUN: 8 mg/dL (ref 6–20)
CO2: 27 mmol/L (ref 22–32)
Calcium: 8.8 mg/dL — ABNORMAL LOW (ref 8.9–10.3)
Chloride: 105 mmol/L (ref 98–111)
Creatinine: 0.88 mg/dL (ref 0.44–1.00)
GFR, Estimated: >60 mL/min (ref >60)
Glucose, Bld: 82 mg/dL (ref 70–99)
Potassium: 3.7 mmol/L (ref 3.5–5.1)
Sodium: 137 mmol/L (ref 135–145)
Total Bilirubin: 0.5 mg/dL (ref 0.0–1.2)
Total Protein: 7.8 g/dL (ref 6.5–8.1)

## 2023-12-16 LAB — IRON AND IRON BINDING CAPACITY (CC-WL,HP ONLY)
Iron: 162 ug/dL (ref 28–170)
Saturation Ratios: 47 % — ABNORMAL HIGH (ref 10.4–31.8)
TIBC: 344 ug/dL (ref 250–450)
UIBC: 182 ug/dL (ref 148–442)

## 2023-12-16 LAB — VITAMIN B12: Vitamin B-12: 245 pg/mL (ref 180–914)

## 2023-12-16 LAB — TSH: TSH: 0.93 u[IU]/mL (ref 0.350–4.500)

## 2023-12-16 LAB — FERRITIN: Ferritin: 27 ng/mL (ref 11–307)

## 2023-12-16 NOTE — Progress Notes (Signed)
  Cancer Center CONSULT NOTE  Patient Care Team: Tysinger, Christiane Cowing, PA-C as PCP - General (Family Medicine) Terri Fester, MD as Consulting Physician (Obstetrics and Gynecology)  CHIEF COMPLAINTS/PURPOSE OF CONSULTATION:  Leukopenia and thrombocytopenia.  ASSESSMENT & PLAN:   Assessment and Plan Assessment & Plan Leukopenia Chronic leukopenia with fluctuating WBC counts. Differential includes infection, medication effects, autoimmune diseases, nutritional deficiencies, hypothyroidism, and bone marrow disorders. No symptoms of infection or autoimmune disease. Minoxidil use noted, but leukopenia predates its use. Bone marrow disorders less likely but will be evaluated with flow cytometry. - Order flow cytometry to evaluate for bone marrow disorders. - Review lab results and contact her after Dec 30, 2023, with findings. - Reassured her about the non-life-threatening nature of the condition.  Thrombocytopenia Intermittent thrombocytopenia with fluctuating platelet counts. Differential includes pseudothrombocytopenia, nutritional deficiencies, autoimmune conditions, and bone marrow disorders. No significant bleeding symptoms. Possible pseudothrombocytopenia due to large platelets miscounted by machines. - Evaluate platelet counts under a microscope to rule out pseudothrombocytopenia. - Order additional lab tests for nutritional deficiencies and autoimmune conditions.  Alopecia areata Alopecia areata, likely stress-induced, with hair regrowth noted. No active treatment required. - Continue current minoxidil treatment for hair regrowth.   HISTORY OF PRESENTING ILLNESS:  Kristina Butler 43 y.o. female is here because of IDA  Discussed the use of AI scribe software for clinical note transcription with the patient, who gave verbal consent to proceed.  History of Present Illness Kristina Butler is a 43 year old female who presents for evaluation of low white blood cell  count.  She has a history of fluctuating blood counts, specifically low white blood cell and platelet counts, which have been inconsistent over time. Her white blood cell count was notably low in February 2024 but normalized by May 2024. No increased infections, unintentional weight loss, or other systemic symptoms.  She has been taking minoxidil for alopecia areata for the past year at a dose of 2.5 mg daily. She attributes her initial hair loss to stress related to family issues, but her hair has since regrown. Her father, a Engineer, civil (consulting), suspects a possible autoimmune condition such as lupus, but she denies joint pain or skin rashes. She does report feeling tired after prolonged sun exposure.  Her past medical history includes a C-section and surgery for a broken hand. 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 There is no known family history of autoimmune diseases or cancer.  All other systems were reviewed with the patient and are negative.  MEDICAL HISTORY:  Past Medical History:  Diagnosis Date   Anemia     SURGICAL HISTORY: No past surgical history on file.  SOCIAL HISTORY: Social History   Socioeconomic History   Marital status: Married    Spouse name: Not on file   Number of children: Not on file   Years of education: Not on file   Highest education level: Not on file  Occupational History   Not on file  Tobacco Use   Smoking status: Never   Smokeless tobacco: Never  Substance and Sexual Activity   Alcohol use: No   Drug use: No   Sexual activity: Not on file  Other Topics Concern   Not on file  Social History Narrative   Not on file   Social Drivers of Health   Financial Resource Strain: Not on file  Food Insecurity: Not on file  Transportation Needs: Not on  file  Physical Activity: Not on file  Stress: Not on file  Social Connections: Not on file  Intimate Partner Violence: Not on file    FAMILY  HISTORY: No family history on file.  ALLERGIES:  is allergic to penicillins.  MEDICATIONS:  Current Outpatient Medications  Medication Sig Dispense Refill   cetirizine  (ZYRTEC  ALLERGY) 10 MG tablet Take 1 tablet (10 mg total) by mouth daily. 30 tablet 0   ibuprofen  (ADVIL ,MOTRIN ) 800 MG tablet Take 1 tablet (800 mg total) by mouth 3 (three) times daily. (Patient not taking: Reported on 12/19/2022) 30 tablet 0   promethazine -dextromethorphan (PROMETHAZINE -DM) 6.25-15 MG/5ML syrup Take 5 mLs by mouth 3 (three) times daily as needed for cough. 200 mL 0   pseudoephedrine  (SUDAFED) 60 MG tablet Take 1 tablet (60 mg total) by mouth every 8 (eight) hours as needed for congestion. 30 tablet 0   SPRINTEC 28 0.25-35 MG-MCG tablet      No current facility-administered medications for this visit.     PHYSICAL EXAMINATION: ECOG PERFORMANCE STATUS: 0 - Asymptomatic  Vitals:   12/16/23 1016  BP: (!) 134/92  Pulse: 96  Resp: 17  Temp: 98.7 F (37.1 C)  SpO2: 99%   Filed Weights   12/16/23 1016  Weight: 146 lb 9.6 oz (66.5 kg)    GENERAL:alert, no distress and comfortable SKIN: skin color, texture, turgor are normal, no rashes or significant lesions EYES: normal, conjunctiva are pink and non-injected, sclera clear OROPHARYNX:no exudate, no erythema and lips, buccal mucosa, and tongue normal  NECK: supple, thyroid normal size, non-tender, without nodularity LYMPH:  no palpable lymphadenopathy in the cervical, axillary  LUNGS: clear to auscultation and percussion with normal breathing effort HEART: regular rate & rhythm and no murmurs and no lower extremity edema ABDOMEN:abdomen soft, non-tender and normal bowel sounds Musculoskeletal:no cyanosis of digits and no clubbing  PSYCH: alert & oriented x 3 with fluent speech NEURO: no focal motor/sensory deficits  LABORATORY DATA:  I have reviewed the data as listed Lab Results  Component Value Date   WBC 6.9 03/07/2010   HGB 12.0  03/07/2010   HCT 36.3 03/07/2010   MCV 90.5 03/07/2010   PLT 100 (L) 03/07/2010     Chemistry      Component Value Date/Time   NA 137 03/07/2010 1425   K 3.8 03/07/2010 1425   CL 106 03/07/2010 1425   CO2 26 03/07/2010 1425   BUN 3 (L) 03/07/2010 1425   CREATININE 0.57 03/07/2010 1425      Component Value Date/Time   CALCIUM 8.2 (L) 03/07/2010 1425   ALKPHOS 95 03/07/2010 1425   AST 44 (H) 03/07/2010 1425   ALT 26 03/07/2010 1425   BILITOT 0.5 03/07/2010 1425       RADIOGRAPHIC STUDIES: I have personally reviewed the radiological images as listed and agreed with the findings in the report. No results found.  All questions were answered. The patient knows to call the clinic with any problems, questions or concerns. I spent 45 minutes in the care of this patient including H and P, review of records, counseling and coordination of care.     Murleen Arms, MD 12/16/2023 10:22 AM

## 2023-12-17 ENCOUNTER — Telehealth: Payer: Self-pay | Admitting: Hematology and Oncology

## 2023-12-17 ENCOUNTER — Encounter: Payer: Self-pay | Admitting: Hematology and Oncology

## 2023-12-17 LAB — FOLATE RBC
Folate, Hemolysate: 367 ng/mL
Folate, RBC: 862 ng/mL (ref >498)
Hematocrit: 42.6 % (ref 34.0–46.6)

## 2023-12-17 NOTE — Telephone Encounter (Signed)
 LEFT PATIENT A VOICEMAIL REGARDING UPCOMING APPTS.

## 2023-12-18 LAB — SURGICAL PATHOLOGY

## 2023-12-19 ENCOUNTER — Encounter: Payer: Self-pay | Admitting: Hematology and Oncology

## 2023-12-19 LAB — FLOW CYTOMETRY

## 2023-12-20 NOTE — Telephone Encounter (Signed)
 Called pt & she did receive Dr Lorella Roles messages from 5/6 & 5/8.  She denies questions.

## 2023-12-23 LAB — FANA STAINING PATTERNS: Speckled Pattern: 24529 — ABNORMAL HIGH

## 2023-12-23 LAB — ANTINUCLEAR ANTIBODIES, IFA: ANA Ab, IFA: POSITIVE — AB

## 2024-01-01 ENCOUNTER — Ambulatory Visit: Payer: Self-pay | Admitting: Hematology and Oncology

## 2024-01-01 DIAGNOSIS — R768 Other specified abnormal immunological findings in serum: Secondary | ICD-10-CM

## 2024-01-02 NOTE — Telephone Encounter (Signed)
 Post calling pt with results of labs showing autoimmune issues-referrel was placed to [facility].  Pt called to discuss results and concern that above office availability would be in November- she is hoping to get in sooner.  This RN contact [facility] was was informed their next openings for new patients would be in August.  This RN faxed pt's demographics/insurance /lab and dictation to above.  Informed pt.

## 2024-03-23 NOTE — Telephone Encounter (Signed)
 Last Office Visit 1/25 Next Office Visit not scheduled Refilled per protocol Eleanor Loyal Mabe, CMA

## 2024-06-02 DIAGNOSIS — L639 Alopecia areata, unspecified: Secondary | ICD-10-CM | POA: Insufficient documentation

## 2024-06-02 DIAGNOSIS — D72819 Decreased white blood cell count, unspecified: Secondary | ICD-10-CM | POA: Insufficient documentation

## 2024-06-02 DIAGNOSIS — D696 Thrombocytopenia, unspecified: Secondary | ICD-10-CM | POA: Insufficient documentation

## 2024-06-02 DIAGNOSIS — L700 Acne vulgaris: Secondary | ICD-10-CM | POA: Insufficient documentation

## 2024-06-02 NOTE — Progress Notes (Deleted)
 Office Visit Note  Patient: Kristina Butler             Date of Birth: 02-Aug-1981           MRN: [id number]             PCP: Bulah Alm RAMAN, PA-C Referring: Loretha Ash, MD Visit Date: 06/16/2024 Occupation: Data Unavailable  Subjective:  No chief complaint on file.   History of Present Illness: [name] is a 43 y.o. female ***     Activities of Daily Living:  Patient reports morning stiffness for *** {minute/hour:19697}.   Patient {ACTIONS;DENIES/REPORTS:21021675::Denies} nocturnal pain.  Difficulty dressing/grooming: {ACTIONS;DENIES/REPORTS:21021675::Denies} Difficulty climbing stairs: {ACTIONS;DENIES/REPORTS:21021675::Denies} Difficulty getting out of chair: {ACTIONS;DENIES/REPORTS:21021675::Denies} Difficulty using hands for taps, buttons, cutlery, and/or writing: {ACTIONS;DENIES/REPORTS:21021675::Denies}  No Rheumatology ROS completed.   PMFS History:  There are no active problems to display for this patient.   Past Medical History:  Diagnosis Date   Anemia     No family history on file. No past surgical history on file. Social History   Tobacco Use   Smoking status: Never   Smokeless tobacco: Never  Substance Use Topics   Alcohol use: No   Drug use: No   Social History   Social History Narrative   Not on file     Immunization History  Administered Date(s) Administered   Influenza, Seasonal, Injecte, Preservative Fre 06/17/2023   Influenza,inj,Quad PF,6+ Mos 05/30/2018, 05/22/2019, 08/11/2021, 07/03/2022     Objective: Vital Signs: There were no vitals taken for this visit.   Physical Exam   Musculoskeletal Exam: ***  CDAI Exam: CDAI Score: -- Patient Global: --; Provider Global: -- Swollen: --; Tender: -- Joint Exam 06/16/2024   No joint exam has been documented for this visit   There is currently no information documented on the homunculus. Go to the Rheumatology activity and complete the homunculus joint  exam.  Investigation: No additional findings.  Imaging: No results found.  Recent Labs: Lab Results  Component Value Date   WBC 2.9 (L) 12/16/2023   HGB 13.8 12/16/2023   PLT 159 12/16/2023   NA 137 12/16/2023   K 3.7 12/16/2023   CL 105 12/16/2023   CO2 27 12/16/2023   GLUCOSE 82 12/16/2023   BUN 8 12/16/2023   CREATININE 0.88 12/16/2023   BILITOT 0.5 12/16/2023   ALKPHOS 37 (L) 12/16/2023   AST 18 12/16/2023   ALT 13 12/16/2023   PROT 7.8 12/16/2023   ALBUMIN 3.9 12/16/2023   CALCIUM 8.8 (L) 12/16/2023   GFRAA  03/07/2010    >60        The eGFR has been calculated using the MDRD equation. This calculation has not been validated in all clinical situations. eGFR's persistently <60 mL/min signify possible Chronic Kidney Disease.   Dec 16, 2023 folate 367, ANA positive, B12 normal, ferritin normal, TSH normal  Speciality Comments: No specialty comments available.  Procedures:  No procedures performed Allergies: Penicillins   Assessment / Plan:     Visit Diagnoses: No diagnosis found.  Orders: No orders of the defined types were placed in this encounter.  No orders of the defined types were placed in this encounter.   Face-to-face time spent with patient was *** minutes. Greater than 50% of time was spent in counseling and coordination of care.  Follow-Up Instructions: No follow-ups on file.   Maya Nash, MD  Note - This record has been created using Animal nutritionist.  Chart creation errors have been sought,  but may not always  have been located. Such creation errors do not reflect on  the standard of medical care.

## 2024-06-16 ENCOUNTER — Encounter: Admitting: Rheumatology

## 2024-06-16 DIAGNOSIS — L639 Alopecia areata, unspecified: Secondary | ICD-10-CM

## 2024-06-16 DIAGNOSIS — R7689 Other specified abnormal immunological findings in serum: Secondary | ICD-10-CM

## 2024-06-16 DIAGNOSIS — L700 Acne vulgaris: Secondary | ICD-10-CM

## 2024-06-16 DIAGNOSIS — D696 Thrombocytopenia, unspecified: Secondary | ICD-10-CM

## 2024-06-16 DIAGNOSIS — D72818 Other decreased white blood cell count: Secondary | ICD-10-CM

## 2024-06-19 ENCOUNTER — Inpatient Hospital Stay: Admitting: Hematology and Oncology

## 2024-06-19 ENCOUNTER — Inpatient Hospital Stay
